# Patient Record
Sex: Male | Born: 1980 | Hispanic: Yes | Marital: Married | Smoking: Never smoker
Health system: Southern US, Community
[De-identification: ages and names within clinical notes are randomized; demographics above are authoritative.]

---

## 2018-02-15 ENCOUNTER — Encounter: Payer: Self-pay | Admitting: Medical

## 2018-04-11 ENCOUNTER — Other Ambulatory Visit: Payer: Self-pay | Admitting: Otolaryngology

## 2018-04-11 ENCOUNTER — Ambulatory Visit
Admission: RE | Admit: 2018-04-11 | Discharge: 2018-04-11 | Disposition: A | Discharge status: Home / Self Care | Payer: BLUE CROSS/BLUE SHIELD | Source: Ambulatory Visit | Attending: Otolaryngology | Admitting: Otolaryngology

## 2018-04-11 DIAGNOSIS — J322 Chronic ethmoidal sinusitis: Secondary | ICD-10-CM

## 2018-04-11 DIAGNOSIS — J321 Chronic frontal sinusitis: Secondary | ICD-10-CM

## 2018-12-25 ENCOUNTER — Other Ambulatory Visit: Payer: Self-pay

## 2018-12-25 DIAGNOSIS — Z20822 Contact with and (suspected) exposure to covid-19: Secondary | ICD-10-CM

## 2018-12-26 LAB — NOVEL CORONAVIRUS, NAA: SARS-CoV-2, NAA: NOT DETECTED

## 2019-08-24 ENCOUNTER — Other Ambulatory Visit: Payer: Self-pay

## 2019-08-24 ENCOUNTER — Encounter (HOSPITAL_COMMUNITY): Payer: Self-pay

## 2019-08-24 ENCOUNTER — Ambulatory Visit (HOSPITAL_COMMUNITY)
Admission: EM | Admit: 2019-08-24 | Discharge: 2019-08-24 | Disposition: A | Discharge status: Home / Self Care | Payer: Medicaid Other | Attending: Family Medicine | Admitting: Family Medicine

## 2019-08-24 DIAGNOSIS — J069 Acute upper respiratory infection, unspecified: Secondary | ICD-10-CM | POA: Insufficient documentation

## 2019-08-24 DIAGNOSIS — Z20822 Contact with and (suspected) exposure to covid-19: Secondary | ICD-10-CM | POA: Insufficient documentation

## 2019-08-24 DIAGNOSIS — Z880 Allergy status to penicillin: Secondary | ICD-10-CM | POA: Diagnosis not present

## 2019-08-24 DIAGNOSIS — Z886 Allergy status to analgesic agent status: Secondary | ICD-10-CM | POA: Diagnosis not present

## 2019-08-24 LAB — SARS CORONAVIRUS 2 (TAT 6-24 HRS): SARS Coronavirus 2: NEGATIVE

## 2019-08-24 MED ORDER — HYDROCODONE-HOMATROPINE 5-1.5 MG/5ML PO SYRP: 5.0000 mL | ORAL_SOLUTION | Freq: Four times a day (QID) | ORAL | 0 refills | Status: AC | PRN

## 2019-08-24 NOTE — ED Provider Notes (Signed)
[place]   [id number] 08/24/19 Arrival Time: 1007  ASSESSMENT & PLAN:  1. Viral URI with cough      COVID-19 testing sent. See letter/work note on file for self-isolation guidelines.  Meds ordered this encounter  Medications  . HYDROcodone-homatropine (HYCODAN) 5-1.5 MG/5ML syrup    Sig: Take 5 mLs by mouth every 6 (six) hours as needed for cough.    Dispense:  120 mL    Refill:  0     Follow-up Information    [facility].   Specialty: Urgent Care Why: As needed. Contact information: [place] [phone]          Reviewed expectations re: course of current medical issues. Questions answered. Outlined signs and symptoms indicating need for more acute intervention. Understanding verbalized. After Visit Summary given.   SUBJECTIVE: History from: patient. Hector Carter is a 39 y.o. male who requests COVID-19 testing. Known COVID-19 contact: none. Recent travel: none. Denies: runny nose, congestion, sore throat, difficulty breathing and headache. Does reports a persistent cough for two weeks; initial cold symptoms that have resolved.. Normal PO intake without n/v/d. Spouse with similar symptoms. Coughing affecting sleep.    OBJECTIVE:  Vitals:   08/24/19 1034 08/24/19 1036  BP:  127/81  Pulse: 78   Temp: 98.5 F (36.9 C)   TempSrc: Oral   SpO2: 99%     General appearance: alert; no distress Eyes: PERRLA; EOMI; conjunctiva normal HENT: [place]; AT Neck: supple  Lungs: speaks full sentences without difficulty; unlabored Extremities: no edema Skin: warm and dry Neurologic: normal gait Psychological: alert and cooperative; normal mood and affect  Labs:  Labs Reviewed  SARS CORONAVIRUS 2 (TAT 6-24 HRS)      Allergies  Allergen Reactions  . Aspirin Other (See Comments)    Unknown, childhood allergy  . Penicillins Other (See Comments)    Unknown, childhood allergy     History reviewed. No pertinent past medical history. Social History   Socioeconomic History  . Marital status: Married    Spouse name: Not on file  . Number of children: Not on file  . Years of education: Not on file  . Highest education level: Not on file  Occupational History  . Not on file  Tobacco Use  . Smoking status: Never Smoker  . Smokeless tobacco: Never Used  Substance and Sexual Activity  . Alcohol use: Not on file  . Drug use: Not on file  . Sexual activity: Not on file  Other Topics Concern  . Not on file  Social History Narrative  . Not on file   Social Determinants of Health   Financial Resource Strain:   . Difficulty of Paying Living Expenses:   Food Insecurity:   . Worried About Charity fundraiser in the Last Year:   . Arboriculturist in the Last Year:   Transportation Needs:   . Film/video editor (Medical):   Marland Kitchen Lack of Transportation (Non-Medical):   Physical Activity:   . Days of Exercise per Week:   . Minutes of Exercise per Session:   Stress:   . Feeling of Stress :   Social Connections:   . Frequency of Communication with Friends and Family:   . Frequency of Social Gatherings with Friends and Family:   . Attends Religious Services:   . Active Member of Clubs or Organizations:   . Attends Archivist Meetings:   .  Marital Status:   Intimate Partner Violence:   . Fear of Current or Ex-Partner:   . Emotionally Abused:   Marland Kitchen Physically Abused:   . Sexually Abused:    History reviewed. No pertinent family history. History reviewed. No pertinent surgical history.   Mardella Layman, MD 08/24/19 [phone]

## 2019-08-24 NOTE — ED Triage Notes (Signed)
Patient reports son was sick 2-3 weeks ago, was never covid tested, told by pcp allergies. Patient started with dry cough 1 week ago that worsens at night, denies fever, chills, congestion.

## 2019-08-24 NOTE — Discharge Instructions (Addendum)
Be aware, your cough medication may cause drowsiness. Please do not drive, operate heavy machinery or make important decisions while on this medication, it can cloud your judgement.  You have been tested for COVID-19 today. If your test returns positive, you will receive a phone call from [facility] regarding your results. Negative test results are not called. Both positive and negative results area always visible on MyChart. If you do not have a MyChart account, sign up instructions are provided in your discharge papers. Please do not hesitate to contact us should you have questions or concerns.  

## 2019-12-03 IMAGING — CR DG SINUSES 1-2V
2 series · 2 of 2 positions shown · non-contrast
Comparison: None.

CLINICAL DATA: Frontal sinusitis

EXAM:
PARANASAL SINUSES - 1-2 VIEW

[w waters]
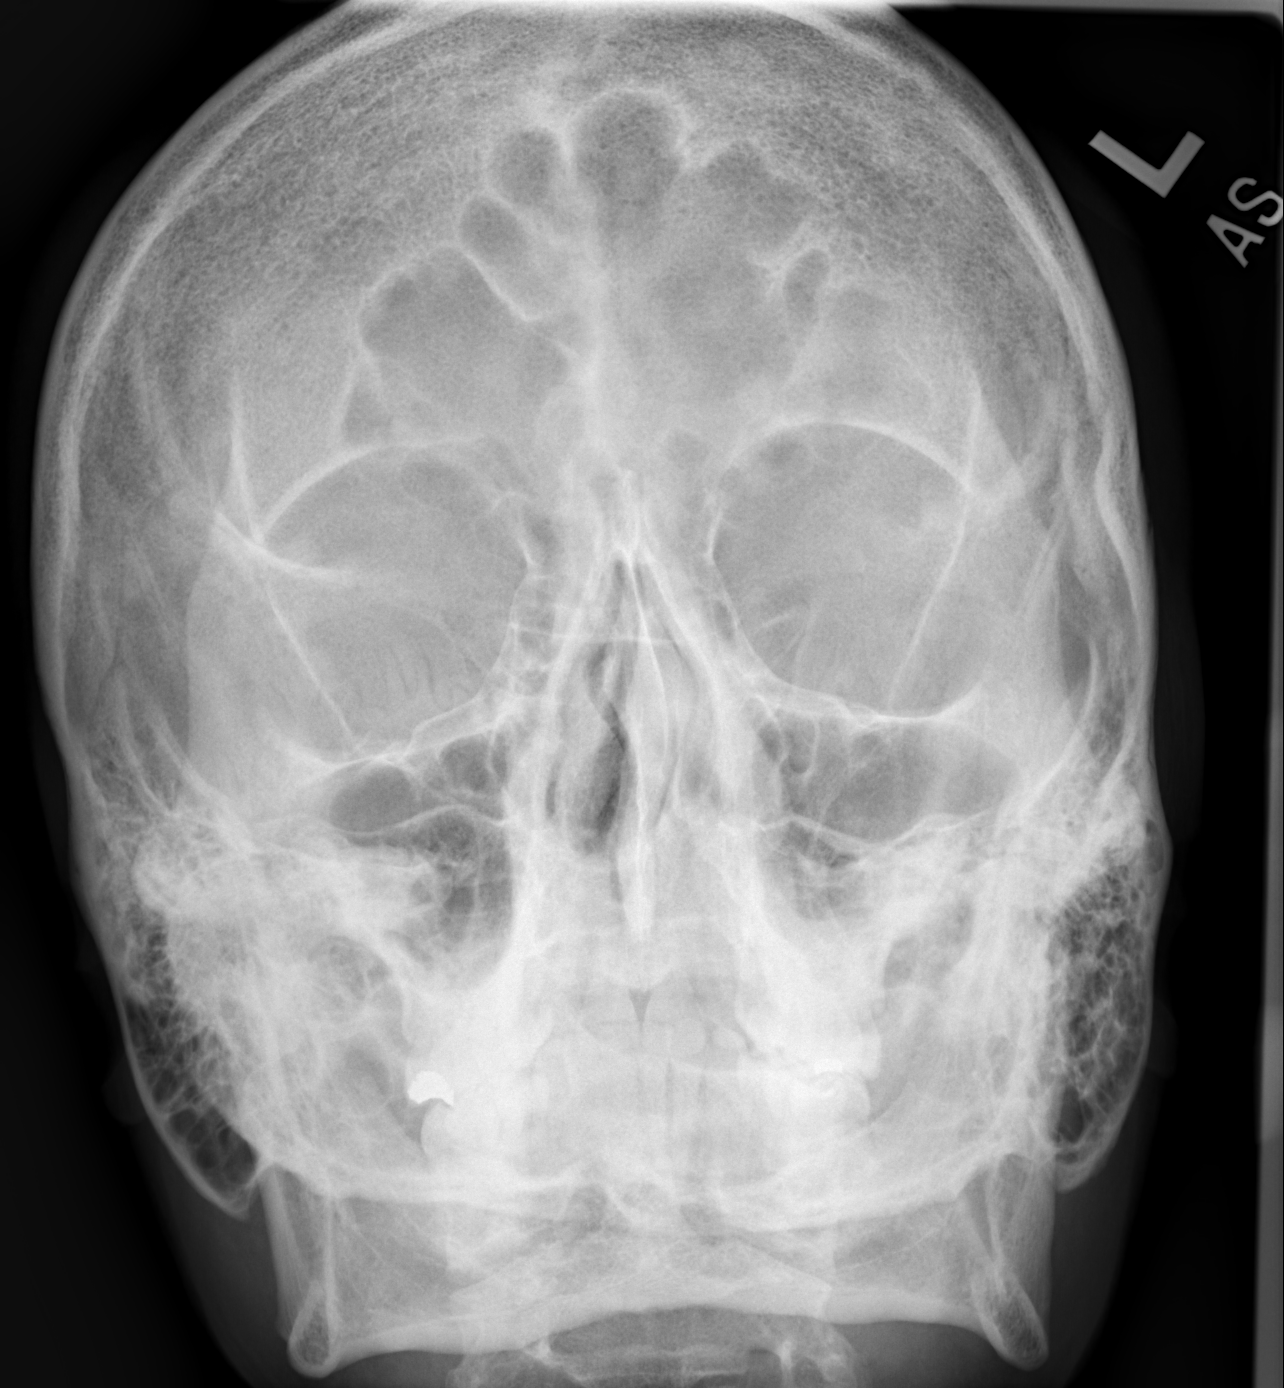

[w skull lat]
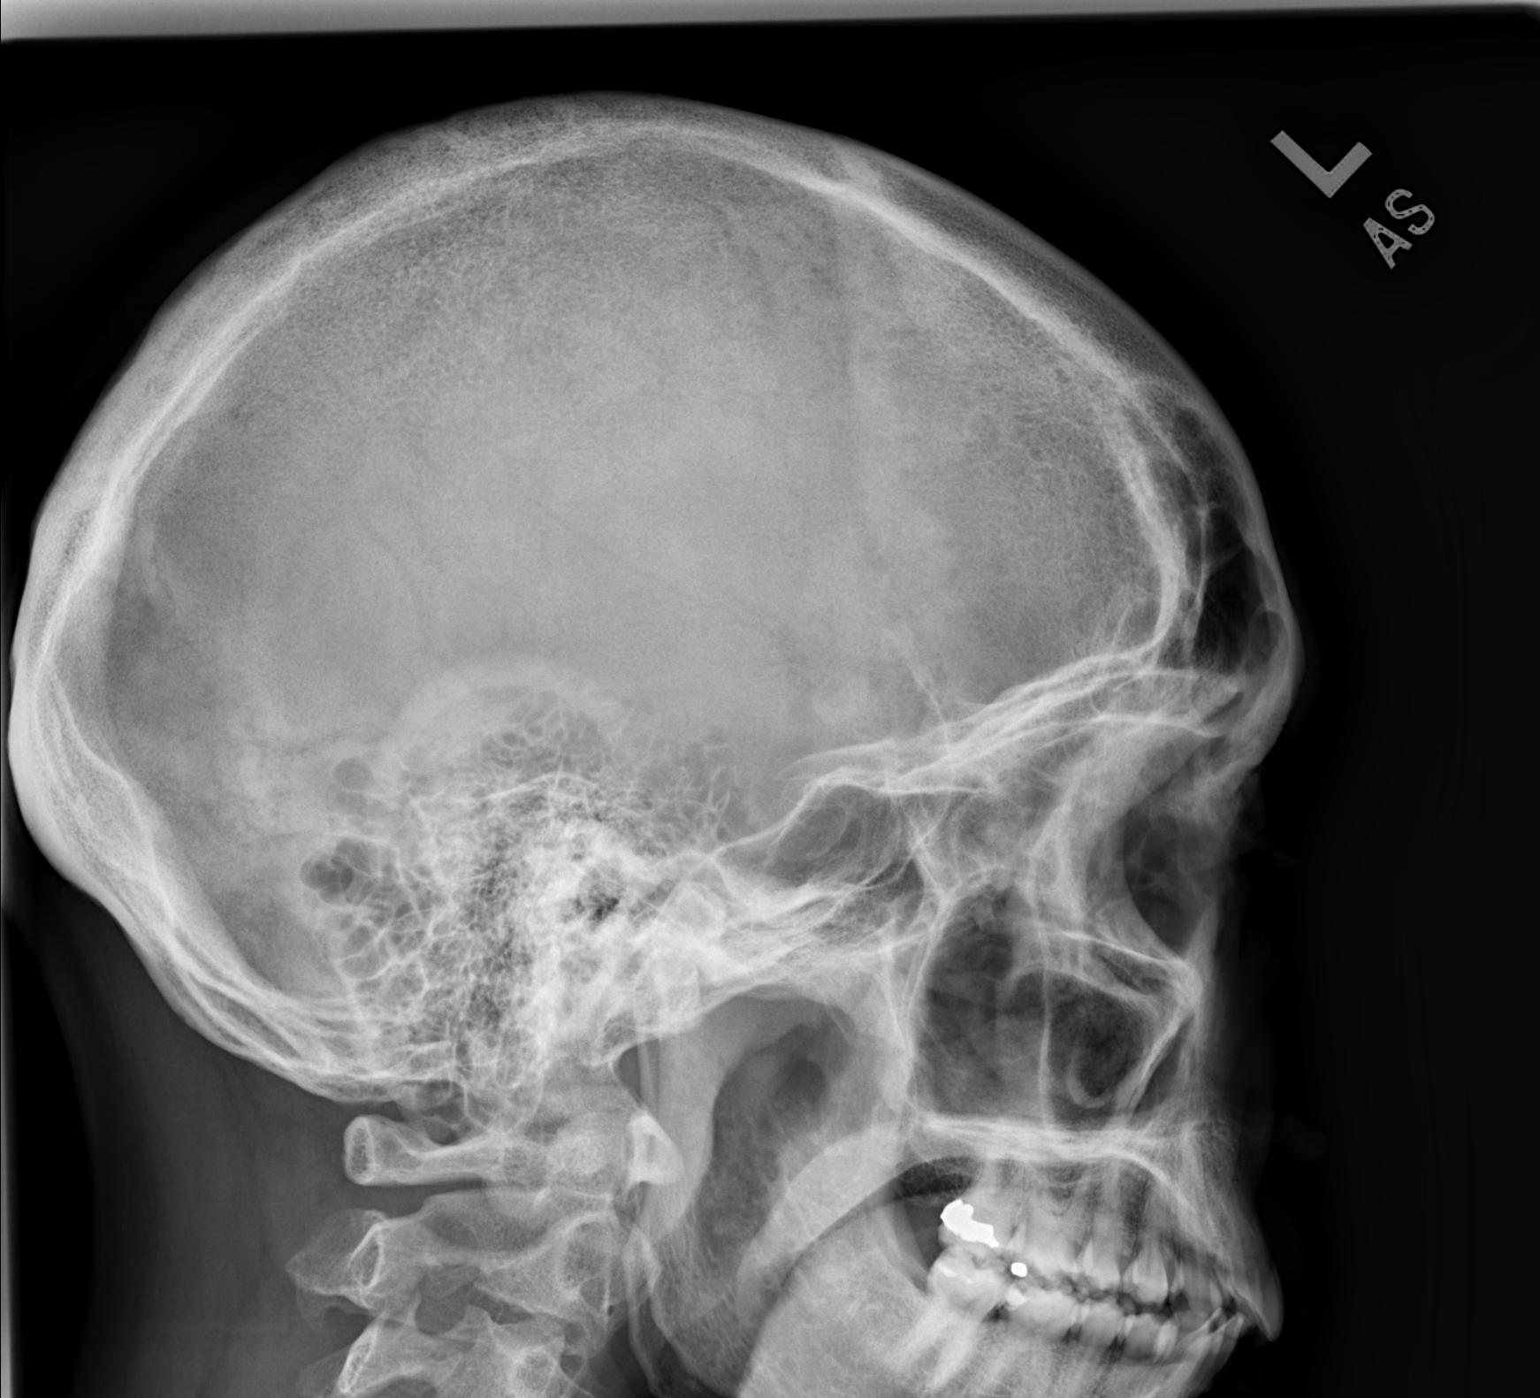

[2 of 2 positions shown; findings below may reference images not displayed]

FINDINGS: The paranasal sinus are aerated. There is no evidence of sinus
opacification air-fluid levels. Mild left maxillary sinus mucosal
thickening. No significant bone abnormalities are seen.
IMPRESSION: No acute sinusitis. Mild mucosal thickening of the left maxillary
sinus.
